# Patient Record
Sex: Female | Born: 1965 | Race: White | Hispanic: No | Marital: Married | Smoking: Never smoker
Health system: Southern US, Community
[De-identification: ages and names within clinical notes are randomized; demographics above are authoritative.]

## PROBLEM LIST (undated history)

## (undated) DIAGNOSIS — E785 Hyperlipidemia, unspecified: Secondary | ICD-10-CM

## (undated) DIAGNOSIS — F419 Anxiety disorder, unspecified: Secondary | ICD-10-CM

## (undated) HISTORY — PX: LUNG LOBECTOMY: SHX167

---

## 2011-11-01 ENCOUNTER — Encounter: Payer: Self-pay | Admitting: *Deleted

## 2011-11-01 ENCOUNTER — Emergency Department
Admission: EM | Admit: 2011-11-01 | Discharge: 2011-11-01 | Disposition: A | Discharge status: Home / Self Care | Payer: 59 | Source: Home / Self Care | Attending: Emergency Medicine | Admitting: Emergency Medicine

## 2011-11-01 DIAGNOSIS — R079 Chest pain, unspecified: Secondary | ICD-10-CM

## 2011-11-01 MED ORDER — METHYLPREDNISOLONE SODIUM SUCC 125 MG IJ SOLR
125.0000 mg | Freq: Once | INTRAMUSCULAR | Status: AC
  Administered 2011-11-01: 125 mg via INTRAMUSCULAR

## 2011-11-01 MED ORDER — METHYLPREDNISOLONE SODIUM SUCC 125 MG IJ SOLR: 125.0000 mg | Freq: Once | INTRAMUSCULAR | Status: DC

## 2011-11-01 NOTE — ED Notes (Signed)
Patient received a flu shot yesterday afternoon. About 3 hours later she started to feel like"something was inmy throat". Today the feeling in her throat has lessened but now has a "tightness" in her chest. No rash, no SOB or distress. EKG done-WNL, 02 sat 100%.

## 2011-11-01 NOTE — ED Provider Notes (Signed)
History     CSN: [id number]  Arrival date & time 11/01/11  1234   First MD Initiated Contact with Patient 11/01/11 1342      Chief Complaint  Patient presents with  . Chest Pain    (Consider location/radiation/quality/duration/timing/severity/associated sxs/prior treatment) HPI This patient presents today with tightness in her upper chest and throat. Yesterday she received a flu shot in her left arm. About 3 hours later she started feeling strange symptoms in her chest. No rashes no shortness of breath or distress. She has had a flu shot in the past with no reaction. 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 She has a slight runny nose and nasal congestion as well. She is not allergic to. She has not started any new medicines recently.   Past Medical History  Diagnosis Date  . Diabetes mellitus     type II, diet controlled    Past Surgical History  Procedure Date  . Lung lobectomy     right lung, partial    Family History  Problem Relation Age of Onset  . Diabetes Mother   . Diabetes Father     History  Substance Use Topics  . Smoking status: Never Smoker   . Smokeless tobacco: Not on file  . Alcohol Use: No    OB History    Grav Para Term Preterm Abortions TAB SAB Ect Mult Living                  Review of Systems  Allergies  Augmentin  Home Medications  No current outpatient prescriptions on file.  BP 120/80  Pulse 72  Temp(Src) 98.7 F (37.1 C) (Oral)  Resp 14  Ht 5\' 6"  (1.676 m)  Wt 150 lb (68.04 kg)  BMI 24.21 kg/m2  SpO2 100%  Physical Exam  Nursing note and vitals reviewed. Constitutional: She is oriented to person, place, and time. She appears well-developed and well-nourished.  Non-toxic appearance. She does not have a sickly appearance. She does not appear ill. No distress.  HENT:  Head: Normocephalic and atraumatic.  Right Ear: Tympanic membrane, external ear and ear canal normal.  Left Ear: Tympanic  membrane, external ear and ear canal normal.  Nose: Nose normal.  Mouth/Throat: No oropharyngeal exudate, posterior oropharyngeal edema or posterior oropharyngeal erythema.       Oropharynx is patent with no tonsillar swelling. No lymphadenopathy or swelling in the neck area.  Eyes: No scleral icterus.  Neck: Trachea normal, normal range of motion and phonation normal. Neck supple.  Cardiovascular: Normal rate, regular rhythm and normal heart sounds.   Pulmonary/Chest: Effort normal and breath sounds normal. No respiratory distress. She has no decreased breath sounds. She has no wheezes. She has no rhonchi.  Neurological: She is alert and oriented to person, place, and time.  Skin: Skin is warm and dry. No rash noted.  Psychiatric: She has a normal mood and affect. Her speech is normal.    ED Course  Procedures (including critical care time)  Labs Reviewed - No data to display No results found.   No diagnosis found.    MDM   An EKG is done in clinic to rule out a cardiac etiology and it is normal. Her oxygen saturation is 100% and her vitals are completely normal. Her physical examination is also completely normal  Differential diagnosis includes a allergic reaction to the flu shot,  and  I have given her a shot of Solu-Medrol  125 today in clinic to make sure that no symptoms become worse. Differential diagnosis also includes the beginning of a viral infection. If she is having any worsening chest pain or shortness of breath, I advised her to go to the emergency room for evaluation.       Lily Kocher, MD 11/01/11 [phone]

## 2012-01-28 ENCOUNTER — Emergency Department
Admission: EM | Admit: 2012-01-28 | Discharge: 2012-01-28 | Disposition: A | Discharge status: Home / Self Care | Payer: 59 | Source: Home / Self Care | Attending: Family Medicine | Admitting: Family Medicine

## 2012-01-28 DIAGNOSIS — N3000 Acute cystitis without hematuria: Secondary | ICD-10-CM

## 2012-01-28 LAB — POCT URINALYSIS DIP (MANUAL ENTRY)
Ketones, POC UA: NEGATIVE
Nitrite, UA: NEGATIVE
Protein Ur, POC: NEGATIVE
Urobilinogen, UA: 0.2 (ref 0–1)
pH, UA: 5.5 (ref 5–8)

## 2012-01-28 MED ORDER — SULFAMETHOXAZOLE-TRIMETHOPRIM 800-160 MG PO TABS: 1.0000 | ORAL_TABLET | Freq: Two times a day (BID) | ORAL | Status: AC

## 2012-01-28 MED ORDER — PHENAZOPYRIDINE HCL 200 MG PO TABS: 200.0000 mg | ORAL_TABLET | Freq: Three times a day (TID) | ORAL | Status: AC

## 2012-01-28 NOTE — Discharge Instructions (Signed)
Continue increased fluid intake.  Urinary Tract Infection Infections of the urinary tract can start in several places. A bladder infection (cystitis), a kidney infection (pyelonephritis), and a prostate infection (prostatitis) are different types of urinary tract infections (UTIs). They usually get better if treated with medicines (antibiotics) that kill germs. Take all the medicine until it is gone. You or your child may feel better in a few days, but TAKE ALL MEDICINE or the infection may not respond and may become more difficult to treat. HOME CARE INSTRUCTIONS   Drink enough water and fluids to keep the urine clear or pale yellow. Cranberry juice is especially recommended, in addition to large amounts of water.   Avoid caffeine, tea, and carbonated beverages. They tend to irritate the bladder.   Alcohol may irritate the prostate.   Only take over-the-counter or prescription medicines for pain, discomfort, or fever as directed by your caregiver.  To prevent further infections:  Empty the bladder often. Avoid holding urine for long periods of time.   After a bowel movement, women should cleanse from front to back. Use each tissue only once.   Empty the bladder before and after sexual intercourse.  FINDING OUT THE RESULTS OF YOUR TEST Not all test results are available during your visit. If your or your child's test results are not back during the visit, make an appointment with your caregiver to find out the results. Do not assume everything is normal if you have not heard from your caregiver or the medical facility. It is important for you to follow up on all test results. SEEK MEDICAL CARE IF:   There is back pain.   Your baby is older than 3 months with a rectal temperature of 100.5 F (38.1 C) or higher for more than 1 day.   Your or your child's problems (symptoms) are no better in 3 days. Return sooner if you or your child is getting worse.  SEEK IMMEDIATE MEDICAL CARE IF:    There is severe back pain or lower abdominal pain.   You or your child develops chills.   You have a fever.   Your baby is older than 3 months with a rectal temperature of 102 F (38.9 C) or higher.   Your baby is 84 months old or younger with a rectal temperature of 100.4 F (38 C) or higher.   There is nausea or vomiting.   There is continued burning or discomfort with urination.  MAKE SURE YOU:   Understand these instructions.   Will watch your condition.   Will get help right away if you are not doing well or get worse.  Document Released: 07/20/2005 Document Revised: 09/29/2011 Document Reviewed: 02/22/2007 [facility] Patient Information 2012 [place], [place].

## 2012-01-28 NOTE — ED Provider Notes (Signed)
History     CSN: [id number]  Arrival date & time 01/28/12  1236   First MD Initiated Contact with Patient 01/28/12 1310      Chief Complaint  Patient presents with  . Urinary Frequency      HPI Comments: Patient complains of developing urinary urgency about 3 to 4 days ago.  She has also had hesitancy, dysuria, and mild left low back ache.  No abdominal or pelvic pain.  No vaginal discharge.  She states that her last menstrual period was in November, and that she normally has irregular periods.  No fevers, chills, and sweats.  No nausea/vomiting.  The history is provided by the patient.    Past Medical History  Diagnosis Date  . Diabetes mellitus     type II, diet controlled    Past Surgical History  Procedure Date  . Lung lobectomy     right lung, partial    Family History  Problem Relation Age of Onset  . Diabetes Mother   . Diabetes Father     History  Substance Use Topics  . Smoking status: Never Smoker   . Smokeless tobacco: Not on file  . Alcohol Use: No    OB History    Grav Para Term Preterm Abortions TAB SAB Ect Mult Living                  Review of Systems No sore throat No cough No pleuritic pain No wheezing No nasal congestion No post-nasal drainage No sinus pain/pressure No itchy/red eyes No earache No hemoptysis No SOB No fever/chills No nausea No vomiting No abdominal pain No diarrhea + urinary symptoms No skin rashes No  fatigue No myalgias No headache   Allergies  Augmentin  Home Medications   Current Outpatient Rx  Name Route Sig Dispense Refill  . PHENAZOPYRIDINE HCL 200 MG PO TABS Oral Take 1 tablet (200 mg total) by mouth 3 (three) times daily. Take with food. 6 tablet 0  . SULFAMETHOXAZOLE-TRIMETHOPRIM 800-160 MG PO TABS Oral Take 1 tablet by mouth 2 (two) times daily. 10 tablet 0    BP 119/83  Pulse 79  Temp(Src) 98.4 F (36.9 C) (Oral)  Resp 18  Ht 5\' 6"  (1.676 m)  Wt 147 lb (66.679 kg)  BMI 23.73 kg/m2  SpO2 99%  Physical Exam Nursing notes and Vital Signs reviewed. Appearance:  Patient appears healthy, stated age, and in no acute distress Eyes:  Pupils are equal, round, and reactive to light and accomodation.  Extraocular movement is intact.  Conjunctivae are not inflamed   Pharynx:  Normal; moist mucous membranes  Neck:  Supple.  No adenopathy Lungs:  Clear to auscultation.  Breath sounds are equal.  Heart:  Regular rate and rhythm without murmurs, rubs, or gallops.  Abdomen:  Nontender without masses or hepatosplenomegaly.  Bowel sounds are present.  Mild left flank tenderness.  Extremities:  No edema.  No calf tenderness Skin:  No rash present.    ED Course  Procedures none   Labs Reviewed  POCT URINALYSIS DIP (MANUAL ENTRY) large blood, large leuks, otherwise negative  aa  URINE CULTURE pending      1. Acute cystitis       MDM  Urine culture pending.  Begin Septra DS and Pyridium. Continue increased fluid  intake. Followup with Family Doctor if not improved 5 days, or if symptoms worsen.        Lattie Haw, MD 01/28/12 1434

## 2012-01-28 NOTE — ED Notes (Signed)
Urinary  Frequency and burning started early this week

## 2012-01-29 LAB — URINE CULTURE
Colony Count: NO GROWTH
Organism ID, Bacteria: NO GROWTH

## 2012-01-30 ENCOUNTER — Telehealth: Payer: Self-pay | Admitting: Family Medicine

## 2015-07-02 ENCOUNTER — Encounter: Payer: Self-pay | Admitting: *Deleted

## 2015-07-02 ENCOUNTER — Emergency Department (INDEPENDENT_AMBULATORY_CARE_PROVIDER_SITE_OTHER): Payer: Managed Care, Other (non HMO)

## 2015-07-02 ENCOUNTER — Emergency Department (INDEPENDENT_AMBULATORY_CARE_PROVIDER_SITE_OTHER)
Admission: EM | Admit: 2015-07-02 | Discharge: 2015-07-02 | Disposition: A | Discharge status: Home / Self Care | Payer: Managed Care, Other (non HMO) | Source: Home / Self Care | Attending: Family Medicine | Admitting: Family Medicine

## 2015-07-02 DIAGNOSIS — X58XXXA Exposure to other specified factors, initial encounter: Secondary | ICD-10-CM

## 2015-07-02 DIAGNOSIS — S92355A Nondisplaced fracture of fifth metatarsal bone, left foot, initial encounter for closed fracture: Secondary | ICD-10-CM

## 2015-07-02 NOTE — Discharge Instructions (Signed)
Apply ice pack for 30 minutes every 1 to 2 hours today and tomorrow.  Elevate.  Use crutches for 3 to 5 days.  Wear Ace wrap until swelling decreases.  Wear cam walker for about 3 weeks.  May take Ibuprofen , 4 tabs every 8 hours with food for pain.   Metatarsal Fracture, Undisplaced A metatarsal fracture is a break in the bone(s) of the foot. These are the bones of the foot that connect your toes to the bones of the ankle. DIAGNOSIS  The diagnoses of these fractures are usually made with X-rays. If there are problems in the forefoot and x-rays are normal a later bone scan will usually make the diagnosis.  TREATMENT AND HOME CARE INSTRUCTIONS  Treatment may or may not include a cast or walking shoe. When casts are needed the use is usually for short periods of time so as not to slow down healing with muscle wasting (atrophy).  Activities should be stopped until further advised by your caregiver.  Wear shoes with adequate shock absorbing capabilities and stiff soles.  Alternative exercise may be undertaken while waiting for healing. These may include bicycling and swimming, or as your caregiver suggests.  It is important to keep all follow-up visits or specialty referrals. The failure to keep these appointments could result in improper bone healing and chronic pain or disability.  Warning: Do not drive a car or operate a motor vehicle until your caregiver specifically tells you it is safe to do so. IF YOU DO NOT HAVE A CAST OR SPLINT:  You may walk on your injured foot as tolerated or advised.  Do not put any weight on your injured foot for as long as directed by your caregiver. Slowly increase the amount of time you walk on the foot as the pain allows or as advised.  Use crutches until you can bear weight without pain. A gradual increase in weight bearing may help.  Apply ice to the injury for 15-20 minutes each hour while awake for the first 2 days. Put the ice in a plastic bag and  place a towel between the bag of ice and your skin.  Only take over-the-counter or prescription medicines for pain, discomfort, or fever as directed by your caregiver. SEEK IMMEDIATE MEDICAL CARE IF:   Your cast gets damaged or breaks.  You have continued severe pain or more swelling than you did before the cast was put on, or the pain is not controlled with medications.  Your skin or nails below the injury turn blue or grey, or feel cold or numb.  There is a bad smell, or new stains or pus-like (purulent) drainage coming from the cast. MAKE SURE YOU:   Understand these instructions.  Will watch your condition.  Will get help right away if you are not doing well or get worse. Document Released: 07/02/2002 Document Revised: 01/02/2012 Document Reviewed: 05/23/2008 [facility] Patient Information 2015 [place], [place]. This information is not intended to replace advice given to you by your health care provider. Make sure you discuss any questions you have with your health care provider.

## 2015-07-02 NOTE — ED Provider Notes (Signed)
CSN: [id number]     Arrival date & time 07/02/15  1747 History   First MD Initiated Contact with Patient 07/02/15 1820     Chief Complaint  Patient presents with  . Foot Injury     HPI Comments: While stepping off a chair today, patient inverted her left foot/ankle and felt immediate pain in the lateral aspect of her foot.  Patient is a 49 y.o. female presenting with foot injury. The history is provided by the patient.  Foot Injury Location:  Foot Time since incident:  2 hours Injury: yes   Mechanism of injury comment:  Inverted foot/ankle Foot location:  L foot Pain details:    Quality:  Aching   Radiates to:  Does not radiate   Severity:  Moderate   Onset quality:  Sudden   Duration:  2 hours   Timing:  Constant   Progression:  Unchanged Chronicity:  New Prior injury to area:  No Relieved by:  None tried Worsened by:  Bearing weight Ineffective treatments:  None tried Associated symptoms: decreased ROM, stiffness and swelling   Associated symptoms: no muscle weakness, no numbness and no tingling     Past Medical History  Diagnosis Date  . Diabetes mellitus     type II, diet controlled   Past Surgical History  Procedure Laterality Date  . Lung lobectomy      right lung, partial   Family History  Problem Relation Age of Onset  . Diabetes Mother   . Diabetes Father    Social History  Substance Use Topics  . Smoking status: Never Smoker   . Smokeless tobacco: None  . Alcohol Use: No   OB History    No data available     Review of Systems  Musculoskeletal: Positive for stiffness.  All other systems reviewed and are negative.   Allergies  Amoxicillin-pot clavulanate  Home Medications   Prior to Admission medications   Not on File   Meds Ordered and Administered this Visit  Medications - No data to display  BP 137/89 mmHg  Pulse 80  Resp 16  Wt 135 lb (61.236 kg)  SpO2 100% No data found.   Physical Exam  Constitutional: She is oriented to  person, place, and time. She appears well-developed and well-nourished. No distress.  HENT:  Head: Atraumatic.  Eyes: Conjunctivae are normal. Pupils are equal, round, and reactive to light.  Musculoskeletal:       Left foot: There is tenderness, bony tenderness and swelling. There is normal range of motion, normal capillary refill, no crepitus, no deformity and no laceration.       Feet:  Left foot has distinct tenderness to palpation laterally over the 5th metatarsal mid-shaft.  Left ankle has full range of motion without tenderness to palpation.  Neurological: She is alert and oriented to person, place, and time.  Skin: Skin is warm and dry.  Nursing note and vitals reviewed.   ED Course  Procedures  None  Imaging Review Dg Foot Complete Left  07/02/2015   CLINICAL DATA:  Fifth metatarsal pain secondary to a fall today.  EXAM: LEFT FOOT - COMPLETE 3+ VIEW  COMPARISON:  None.  FINDINGS: There is a nondisplaced spiral fracture of the shaft of the fifth metatarsal. Osseous structures are otherwise normal.  IMPRESSION: Nondisplaced spiral fracture of the shaft of the fifth metatarsal.   Electronically Signed   By: Francene Boyers M.D.   On: 07/02/2015 19:02      MDM  1. Closed nondisplaced fracture of fifth left metatarsal bone, initial encounter    Discussed with Dr. Rodney Langton.  Ace wrap and cam walker applied.  Patient has crutches at home.  Apply ice pack for 30 minutes every 1 to 2 hours today and tomorrow.  Elevate.  Use crutches for 3 to 5 days.  Wear Ace wrap until swelling decreases.  Wear cam walker for about 3 weeks.  May take Ibuprofen 200mg , 4 tabs every 8 hours with food for pain. Followup with Dr. Rodney Langton or Dr. Clementeen [facility] ([facility]) in one week.     Lattie Haw, MD 07/02/15 [phone]

## 2015-07-02 NOTE — ED Notes (Signed)
Pt reports stepping off of a chair @ school, rolling her left foot. Pain, swelling and bruising present. No previous injury.

## 2015-07-13 ENCOUNTER — Ambulatory Visit (INDEPENDENT_AMBULATORY_CARE_PROVIDER_SITE_OTHER): Payer: Managed Care, Other (non HMO) | Admitting: Family Medicine

## 2015-07-13 ENCOUNTER — Encounter: Payer: Self-pay | Admitting: Family Medicine

## 2015-07-13 ENCOUNTER — Ambulatory Visit (INDEPENDENT_AMBULATORY_CARE_PROVIDER_SITE_OTHER): Payer: Managed Care, Other (non HMO)

## 2015-07-13 VITALS — BP 139/79 | HR 96 | Wt 135.0 lb

## 2015-07-13 DIAGNOSIS — S92302A Fracture of unspecified metatarsal bone(s), left foot, initial encounter for closed fracture: Secondary | ICD-10-CM | POA: Diagnosis not present

## 2015-07-13 DIAGNOSIS — S92353A Displaced fracture of fifth metatarsal bone, unspecified foot, initial encounter for closed fracture: Secondary | ICD-10-CM | POA: Insufficient documentation

## 2015-07-13 DIAGNOSIS — S92355D Nondisplaced fracture of fifth metatarsal bone, left foot, subsequent encounter for fracture with routine healing: Secondary | ICD-10-CM | POA: Diagnosis not present

## 2015-07-13 DIAGNOSIS — X58XXXD Exposure to other specified factors, subsequent encounter: Secondary | ICD-10-CM | POA: Diagnosis not present

## 2015-07-13 NOTE — Patient Instructions (Signed)
Thank you for coming in today. Continue the cam walker with crutches as needed.,  Follow up with me in 2 weeks.  Let pain be your guide.  Consider contacting a workers comp Social worker firm to see if you have a case.   Metatarsal Fracture, Undisplaced A metatarsal fracture is a break in the bone(s) of the foot. These are the bones of the foot that connect your toes to the bones of the ankle. DIAGNOSIS  The diagnoses of these fractures are usually made with X-rays. If there are problems in the forefoot and x-rays are normal a later bone scan will usually make the diagnosis.  TREATMENT AND HOME CARE INSTRUCTIONS  Treatment may or may not include a cast or walking shoe. When casts are needed the use is usually for short periods of time so as not to slow down healing with muscle wasting (atrophy).  Activities should be stopped until further advised by your caregiver.  Wear shoes with adequate shock absorbing capabilities and stiff soles.  Alternative exercise may be undertaken while waiting for healing. These may include bicycling and swimming, or as your caregiver suggests.  It is important to keep all follow-up visits or specialty referrals. The failure to keep these appointments could result in improper bone healing and chronic pain or disability.  Warning: Do not drive a car or operate a motor vehicle until your caregiver specifically tells you it is safe to do so. IF YOU DO NOT HAVE A CAST OR SPLINT:  You may walk on your injured foot as tolerated or advised.  Do not put any weight on your injured foot for as long as directed by your caregiver. Slowly increase the amount of time you walk on the foot as the pain allows or as advised.  Use crutches until you can bear weight without pain. A gradual increase in weight bearing may help.  Apply ice to the injury for 15-20 minutes each hour while awake for the first 2 days. Put the ice in a plastic bag and place a towel between the bag of ice and  your skin.  Only take over-the-counter or prescription medicines for pain, discomfort, or fever as directed by your caregiver. SEEK IMMEDIATE MEDICAL CARE IF:   Your cast gets damaged or breaks.  You have continued severe pain or more swelling than you did before the cast was put on, or the pain is not controlled with medications.  Your skin or nails below the injury turn blue or grey, or feel cold or numb.  There is a bad smell, or new stains or pus-like (purulent) drainage coming from the cast. MAKE SURE YOU:   Understand these instructions.  Will watch your condition.  Will get help right away if you are not doing well or get worse. Document Released: 07/02/2002 Document Revised: 01/02/2012 Document Reviewed: 05/23/2008 [facility] Patient Information 2015 [place], [place]. This information is not intended to replace advice given to you by your health care provider. Make sure you discuss any questions you have with your health care provider.

## 2015-07-13 NOTE — Progress Notes (Signed)
   Subjective:    I'm seeing this patient as a consultation for:  Dr. Cathren Harsh  CC: Left fifth metatarsal fracture  HPI: Patient suffered a metatarsal fracture work on 07/02/2015. She was seen in urgent care with a fracture was diagnosed. She was placed into a cam walking boot nonweightbearing status. She notes that she is feeling pretty well with minimal pain. She's tried ibuprofen for pain which helps some. No radiating pain weakness or numbness fevers or chills.  Past medical history, Surgical history, Family history not pertinant except as noted below, Social history, Allergies, and medications have been entered into the medical record, reviewed, and no changes needed.   Review of Systems: No headache, visual changes, nausea, vomiting, diarrhea, constipation, dizziness, abdominal pain, skin rash, fevers, chills, night sweats, weight loss, swollen lymph nodes, body aches, joint swelling, muscle aches, chest pain, shortness of breath, mood changes, visual or auditory hallucinations.   Objective:    Filed Vitals:   07/13/15 1501  BP: 139/79  Pulse: 96   General: Well Developed, well nourished, and in no acute distress.  Neuro/Psych: Alert and oriented x3, extra-ocular muscles intact, able to move all 4 extremities, sensation grossly intact. Skin: Warm and dry, no rashes noted.  Respiratory: Not using accessory muscles, speaking in full sentences, trachea midline.  Cardiovascular: Pulses palpable, no extremity edema. Abdomen: Does not appear distended. MSK: Left foot mild ecchymosis and swelling. Minimally tender over the distal fifth metatarsal shaft. Pulses capillary refill sensation intact. Normal ankle motion.   X-ray pending. Preliminary review of x-ray shows a spiral type fifth metatarsal shaft fracture towards the distal end of the shaft without significant displacement. Awaiting formal x-ray review  No results found for this or any previous visit (from the past 24 hour(s)). No  results found.  Impression and Recommendations:   This case required medical decision making of moderate complexity.

## 2015-07-13 NOTE — Assessment & Plan Note (Signed)
Doing well. Continue cam walker.*Limited weightbearing advance to full weightbearing. Return in 2 weeks for repeat x-ray.

## 2015-07-27 ENCOUNTER — Encounter: Payer: Self-pay | Admitting: Family Medicine

## 2015-07-27 ENCOUNTER — Ambulatory Visit (INDEPENDENT_AMBULATORY_CARE_PROVIDER_SITE_OTHER): Payer: Managed Care, Other (non HMO) | Admitting: Family Medicine

## 2015-07-27 ENCOUNTER — Ambulatory Visit (INDEPENDENT_AMBULATORY_CARE_PROVIDER_SITE_OTHER): Payer: Managed Care, Other (non HMO)

## 2015-07-27 VITALS — BP 125/79 | HR 90 | Wt 147.0 lb

## 2015-07-27 DIAGNOSIS — Z780 Asymptomatic menopausal state: Secondary | ICD-10-CM

## 2015-07-27 DIAGNOSIS — X58XXXD Exposure to other specified factors, subsequent encounter: Secondary | ICD-10-CM

## 2015-07-27 DIAGNOSIS — S92301D Fracture of unspecified metatarsal bone(s), right foot, subsequent encounter for fracture with routine healing: Secondary | ICD-10-CM | POA: Diagnosis not present

## 2015-07-27 DIAGNOSIS — S92352D Displaced fracture of fifth metatarsal bone, left foot, subsequent encounter for fracture with routine healing: Secondary | ICD-10-CM | POA: Diagnosis not present

## 2015-07-27 NOTE — Assessment & Plan Note (Signed)
Delayed healing. Continue current management with limited weightbearing cam walker boot. Arrange for bone density test to evaluate for osteoporosis.

## 2015-07-27 NOTE — Patient Instructions (Signed)
Thank you for coming in today. Return in 2 weeks for recheck.  Limit weight bearing.  You should have a bone density test soon.  They should call you.  Let me know if you do not hear back.

## 2015-07-27 NOTE — Progress Notes (Signed)
Lori Pratt is a 49 y.o. female who presents to [name]  today for foot fracture. Patient suffered a fracture to her left fifth metatarsal on September 8. She was seen in my clinic on the 19th. In the interim she has continued limited weightbearing status with a cam walker boot. She feels well and notes pain has reduced.  Patient notes she is about 5 years postmenopausal.   Past Medical History  Diagnosis Date  . Diabetes mellitus     type II, diet controlled   Past Surgical History  Procedure Laterality Date  . Lung lobectomy      right lung, partial   Social History  Substance Use Topics  . Smoking status: Never Smoker   . Smokeless tobacco: Not on file  . Alcohol Use: No   family history includes Diabetes in her father and mother; Leukemia in her maternal grandmother.  ROS as above Medications: Current Outpatient Prescriptions  Medication Sig Dispense Refill  . ibuprofen (ADVIL,MOTRIN) 200 MG tablet Take 200 mg by mouth every 6 (six) hours as needed.     No current facility-administered medications for this visit.   Allergies  Allergen Reactions  . Amoxicillin-Pot Clavulanate Rash     Exam:  BP 125/79 mmHg  Pulse 90  Wt 147 lb (66.679 kg) Gen: Well NAD HEENT: EOMI,  MMM Lungs: Normal work of breathing. CTABL Heart: RRR no MRG Abd: NABS, Soft. Nondistended, Nontender Exts: Brisk capillary refill, warm and well perfused.  Left foot well-appearing nontender no effusion or ecchymosis  Preliminary x-ray of left foot shows bone absorption at the fracture site without definitive callus formation. Nondisplaced. Awaiting formal radiology read.  No results found for this or any previous visit (from the past 24 hour(s)). No results found.   Please see individual assessment and plan sections.

## 2015-07-28 NOTE — Progress Notes (Signed)
Quick Note:  The fracture is healing. ______

## 2015-07-29 ENCOUNTER — Telehealth: Payer: Self-pay

## 2015-07-29 NOTE — Telephone Encounter (Signed)
Patient advised of results.

## 2015-08-06 ENCOUNTER — Other Ambulatory Visit: Payer: Managed Care, Other (non HMO)

## 2015-08-10 ENCOUNTER — Ambulatory Visit (INDEPENDENT_AMBULATORY_CARE_PROVIDER_SITE_OTHER): Payer: Managed Care, Other (non HMO)

## 2015-08-10 ENCOUNTER — Ambulatory Visit (INDEPENDENT_AMBULATORY_CARE_PROVIDER_SITE_OTHER): Payer: Managed Care, Other (non HMO) | Admitting: Family Medicine

## 2015-08-10 ENCOUNTER — Encounter: Payer: Self-pay | Admitting: Family Medicine

## 2015-08-10 VITALS — BP 144/88 | HR 87 | Wt 147.0 lb

## 2015-08-10 DIAGNOSIS — S92352D Displaced fracture of fifth metatarsal bone, left foot, subsequent encounter for fracture with routine healing: Secondary | ICD-10-CM

## 2015-08-10 DIAGNOSIS — S92302D Fracture of unspecified metatarsal bone(s), left foot, subsequent encounter for fracture with routine healing: Secondary | ICD-10-CM

## 2015-08-10 DIAGNOSIS — X58XXXD Exposure to other specified factors, subsequent encounter: Secondary | ICD-10-CM | POA: Diagnosis not present

## 2015-08-10 NOTE — Patient Instructions (Signed)
Thank you for coming in today. Switch to a post op shoe.  Stop the crutches if you can.  Be careful with it.  Return in 2 weeks.

## 2015-08-10 NOTE — Progress Notes (Signed)
Lori Pratt is a 49 y.o. female who [name]  today for follow-up foot fracture. Patient was seen originally on September 8 for a fifth metatarsal fracture. In the interim she's been limited weightbearing and Cam Walker boot. She was doing well with minimal to no pain until lunchtime today when she accidentally bumped her foot causing worse pain.    Past Medical History  Diagnosis Date  . Diabetes mellitus     type II, diet controlled   Past Surgical History  Procedure Laterality Date  . Lung lobectomy      right lung, partial   Social History  Substance Use Topics  . Smoking status: Never Smoker   . Smokeless tobacco: Not on file  . Alcohol Use: No   family history includes Diabetes in her father and mother; Leukemia in her maternal grandmother.  ROS as above Medications: Current Outpatient Prescriptions  Medication Sig Dispense Refill  . ibuprofen (ADVIL,MOTRIN) 200 MG tablet Take 200 mg by mouth every 6 (six) hours as needed.     No current facility-administered medications for this visit.   Allergies  Allergen Reactions  . Amoxicillin-Pot Clavulanate Rash     Exam:  BP 144/88 mmHg  Pulse 87  Wt 147 lb (66.679 kg) Gen: Well NAD Left foot well-appearing nontender normal pulses capillary refill and sensation.  No results found for this or any previous visit (from the past 24 hour(s)). No results found.   Please see individual assessment and plan sections.

## 2015-08-10 NOTE — Assessment & Plan Note (Signed)
Healing well. Bone density test pending. Transition to postop shoe. Recheck 2 weeks.

## 2015-08-13 ENCOUNTER — Ambulatory Visit (INDEPENDENT_AMBULATORY_CARE_PROVIDER_SITE_OTHER): Payer: Managed Care, Other (non HMO)

## 2015-08-13 DIAGNOSIS — Z13820 Encounter for screening for osteoporosis: Secondary | ICD-10-CM | POA: Diagnosis not present

## 2015-08-13 DIAGNOSIS — Z780 Asymptomatic menopausal state: Secondary | ICD-10-CM | POA: Diagnosis not present

## 2015-08-13 DIAGNOSIS — S92301D Fracture of unspecified metatarsal bone(s), right foot, subsequent encounter for fracture with routine healing: Secondary | ICD-10-CM

## 2015-08-14 ENCOUNTER — Encounter: Payer: Self-pay | Admitting: Family Medicine

## 2015-08-14 DIAGNOSIS — M8580 Other specified disorders of bone density and structure, unspecified site: Secondary | ICD-10-CM | POA: Insufficient documentation

## 2015-08-14 NOTE — Progress Notes (Signed)
Quick Note:  Bone density shows osteopenia. This is early or mild osteoperosis. I recommend taking calcium and vitamin D supplementation. At 49 years old and may be reasonable to start medicines to help strengthen the bones. ______

## 2015-08-24 ENCOUNTER — Ambulatory Visit (INDEPENDENT_AMBULATORY_CARE_PROVIDER_SITE_OTHER): Payer: Managed Care, Other (non HMO)

## 2015-08-24 ENCOUNTER — Ambulatory Visit (INDEPENDENT_AMBULATORY_CARE_PROVIDER_SITE_OTHER): Payer: Managed Care, Other (non HMO) | Admitting: Family Medicine

## 2015-08-24 ENCOUNTER — Encounter: Payer: Self-pay | Admitting: Family Medicine

## 2015-08-24 VITALS — BP 129/83 | HR 85 | Wt 150.0 lb

## 2015-08-24 DIAGNOSIS — E559 Vitamin D deficiency, unspecified: Secondary | ICD-10-CM

## 2015-08-24 DIAGNOSIS — S92352D Displaced fracture of fifth metatarsal bone, left foot, subsequent encounter for fracture with routine healing: Secondary | ICD-10-CM

## 2015-08-24 DIAGNOSIS — S92302D Fracture of unspecified metatarsal bone(s), left foot, subsequent encounter for fracture with routine healing: Secondary | ICD-10-CM | POA: Diagnosis not present

## 2015-08-24 DIAGNOSIS — X58XXXD Exposure to other specified factors, subsequent encounter: Secondary | ICD-10-CM | POA: Diagnosis not present

## 2015-08-24 DIAGNOSIS — M8580 Other specified disorders of bone density and structure, unspecified site: Secondary | ICD-10-CM

## 2015-08-24 NOTE — Progress Notes (Signed)
Lori Pratt is a 49 y.o. female who [name]  today for follow-up fifth metatarsal fracture. Patient was originally seen in early September where she was diagnosed with a fracture of her left fifth metatarsal. She was initially casted placed into a cam walker. For the last 2 weeks she's been using a rigid soled postoperative shoe. She notes the pain is much better controlled. She denies any fevers chills nausea vomiting or diarrhea.  Additionally the last visit she had a bone density scan ordered which showed osteopenia.   Past Medical History  Diagnosis Date  . Diabetes mellitus     type II, diet controlled   Past Surgical History  Procedure Laterality Date  . Lung lobectomy      right lung, partial   Social History  Substance Use Topics  . Smoking status: Never Smoker   . Smokeless tobacco: Not on file  . Alcohol Use: No   family history includes Diabetes in her father and mother; Leukemia in her maternal grandmother.  ROS as above Medications: Current Outpatient Prescriptions  Medication Sig Dispense Refill  . ibuprofen (ADVIL,MOTRIN) 200 MG tablet Take 200 mg by mouth every 6 (six) hours as needed.     No current facility-administered medications for this visit.   Allergies  Allergen Reactions  . Amoxicillin-Pot Clavulanate Rash     Exam:  BP 129/83 mmHg  Pulse 85  Wt 150 lb (68.04 kg) Gen: Well NAD HEENT: EOMI,  MMM Lungs: Normal work of breathing. CTABL Heart: RRR no MRG Abd: NABS, Soft. Nondistended, Nontender Exts: Brisk capillary refill, warm and well perfused.   No results found for this or any previous visit (from the past 24 hour(s)). No results found.   Please see individual assessment and plan sections.

## 2015-08-24 NOTE — Patient Instructions (Signed)
Thank you for coming in today. Continue the shoe.  Return in 2-4 weeks.  We will check Vitamin D levels today.

## 2015-08-26 NOTE — Assessment & Plan Note (Signed)
Delayed healing. Continue postoperative shoe as patient is still tender. Recheck in about 2-4 weeks.

## 2015-09-12 LAB — VITAMIN D 25 HYDROXY (VIT D DEFICIENCY, FRACTURES): Vit D, 25-Hydroxy: 20 ng/mL — ABNORMAL LOW (ref 30–100)

## 2015-09-14 ENCOUNTER — Ambulatory Visit (INDEPENDENT_AMBULATORY_CARE_PROVIDER_SITE_OTHER): Payer: Managed Care, Other (non HMO) | Admitting: Family Medicine

## 2015-09-14 ENCOUNTER — Ambulatory Visit (INDEPENDENT_AMBULATORY_CARE_PROVIDER_SITE_OTHER): Payer: Managed Care, Other (non HMO)

## 2015-09-14 ENCOUNTER — Encounter: Payer: Self-pay | Admitting: Family Medicine

## 2015-09-14 VITALS — BP 144/88 | HR 76 | Wt 150.0 lb

## 2015-09-14 DIAGNOSIS — S92352G Displaced fracture of fifth metatarsal bone, left foot, subsequent encounter for fracture with delayed healing: Secondary | ICD-10-CM | POA: Diagnosis not present

## 2015-09-14 DIAGNOSIS — E559 Vitamin D deficiency, unspecified: Secondary | ICD-10-CM | POA: Insufficient documentation

## 2015-09-14 DIAGNOSIS — S92302G Fracture of unspecified metatarsal bone(s), left foot, subsequent encounter for fracture with delayed healing: Secondary | ICD-10-CM

## 2015-09-14 DIAGNOSIS — X58XXXD Exposure to other specified factors, subsequent encounter: Secondary | ICD-10-CM | POA: Diagnosis not present

## 2015-09-14 MED ORDER — VITAMIN D (ERGOCALCIFEROL) 1.25 MG (50000 UNIT) PO CAPS: 50000.0000 [IU] | ORAL_CAPSULE | ORAL | Status: DC

## 2015-09-14 NOTE — Progress Notes (Signed)
Lori Pratt is a 49 y.o. female who [name]  today for follow-up foot fracture. Patient was originally seen in urgent care on September 8th. She's been treated with casting and slowly advanced to weightbearing. On October 31 she was last seen and treated with a postoperative shoe. Since then she's noted worsening soreness in the area of the fracture. She's not had much healing on x-ray.   Past Medical History  Diagnosis Date  . Diabetes mellitus     type II, diet controlled   Past Surgical History  Procedure Laterality Date  . Lung lobectomy      right lung, partial   Social History  Substance Use Topics  . Smoking status: Never Smoker   . Smokeless tobacco: Not on file  . Alcohol Use: No   family history includes Diabetes in her father and mother; Leukemia in her maternal grandmother.  ROS as above Medications: Current Outpatient Prescriptions  Medication Sig Dispense Refill  . ibuprofen (ADVIL,MOTRIN) 200 MG tablet Take 200 mg by mouth every 6 (six) hours as needed.    . Vitamin D, Ergocalciferol, (DRISDOL) 50000 UNITS CAPS capsule Take 1 capsule (50,000 Units total) by mouth every 7 (seven) days. Take for 8 total doses(weeks) 8 capsule 0   No current facility-administered medications for this visit.   Allergies  Allergen Reactions  . Amoxicillin-Pot Clavulanate Rash     Exam:  BP 144/88 mmHg  Pulse 76  Wt 150 lb (68.04 kg) Gen: Well NAD Left foot normal-appearing no swelling or ecchymosis. Mildly tender to palpation distal fifth metatarsal.  Pulses capillary refill and sensation intact   Preliminary x-ray of the fracture of the left fifth metatarsal shows a nonsignificant change since previous x-ray. Awaiting formal radiology review.  No results found for this or any previous visit (from the past 24 hour(s)). No results found.   Please see individual assessment and plan sections.

## 2015-09-14 NOTE — Addendum Note (Signed)
Addended by: Rodolph BongOREY, Shakenya Stoneberg S on: 09/14/2015 08:54 AM   Modules accepted: Orders

## 2015-09-14 NOTE — Patient Instructions (Signed)
Thank you for coming in today. We will get a CT to discuss fracture.  We will call after CT results back.

## 2015-09-14 NOTE — Progress Notes (Signed)
Quick Note:  Vit D is quite low. I prescribed Vit D pills. ______

## 2015-09-14 NOTE — Assessment & Plan Note (Signed)
Not healed radiographically, and not healed clinically. Obtain CT scan of foot without contrast to evaluate fracture. She with results.

## 2015-09-15 ENCOUNTER — Ambulatory Visit (INDEPENDENT_AMBULATORY_CARE_PROVIDER_SITE_OTHER): Payer: Managed Care, Other (non HMO)

## 2015-09-15 ENCOUNTER — Telehealth: Payer: Self-pay | Admitting: Family Medicine

## 2015-09-15 DIAGNOSIS — X58XXXD Exposure to other specified factors, subsequent encounter: Secondary | ICD-10-CM

## 2015-09-15 DIAGNOSIS — S92355D Nondisplaced fracture of fifth metatarsal bone, left foot, subsequent encounter for fracture with routine healing: Secondary | ICD-10-CM | POA: Diagnosis not present

## 2015-09-15 DIAGNOSIS — S92302G Fracture of unspecified metatarsal bone(s), left foot, subsequent encounter for fracture with delayed healing: Secondary | ICD-10-CM

## 2015-09-15 NOTE — Telephone Encounter (Signed)
Discussed results of CT scan with pt.

## 2015-09-15 NOTE — Progress Notes (Signed)
Quick Note:  The xray shows some healing!!!!! I still think the CT is the right next test but things are less grim than I thought. ______

## 2015-10-05 ENCOUNTER — Ambulatory Visit (INDEPENDENT_AMBULATORY_CARE_PROVIDER_SITE_OTHER): Payer: Managed Care, Other (non HMO)

## 2015-10-05 ENCOUNTER — Ambulatory Visit (INDEPENDENT_AMBULATORY_CARE_PROVIDER_SITE_OTHER): Payer: Managed Care, Other (non HMO) | Admitting: Family Medicine

## 2015-10-05 ENCOUNTER — Encounter: Payer: Self-pay | Admitting: Family Medicine

## 2015-10-05 VITALS — BP 148/90 | HR 92 | Wt 151.0 lb

## 2015-10-05 DIAGNOSIS — S92302G Fracture of unspecified metatarsal bone(s), left foot, subsequent encounter for fracture with delayed healing: Secondary | ICD-10-CM | POA: Diagnosis not present

## 2015-10-05 DIAGNOSIS — X58XXXD Exposure to other specified factors, subsequent encounter: Secondary | ICD-10-CM

## 2015-10-05 DIAGNOSIS — S92352D Displaced fracture of fifth metatarsal bone, left foot, subsequent encounter for fracture with routine healing: Secondary | ICD-10-CM | POA: Diagnosis not present

## 2015-10-05 NOTE — Progress Notes (Signed)
Lori Pratt is a 49 y.o. female who [name] today for follow-up foot fracture. Patient was originally seen on September 19 for distal fifth metatarsal fracture of the left foot. She's been treated subsequently with immobilization and nonweightbearing now to postoperative shoe. She was thought to have poor bone healing over CT scan on 09/15/2015 showed good healing.    Past Medical History  Diagnosis Date  . Diabetes mellitus     type II, diet controlled   Past Surgical History  Procedure Laterality Date  . Lung lobectomy      right lung, partial   Social History  Substance Use Topics  . Smoking status: Never Smoker   . Smokeless tobacco: Not on file  . Alcohol Use: No   family history includes Diabetes in her father and mother; Leukemia in her maternal grandmother.  ROS as above Medications: Current Outpatient Prescriptions  Medication Sig Dispense Refill  . ibuprofen (ADVIL,MOTRIN) 200 MG tablet Take 200 mg by mouth every 6 (six) hours as needed.    . Vitamin D, Ergocalciferol, (DRISDOL) 50000 UNITS CAPS capsule Take 1 capsule (50,000 Units total) by mouth every 7 (seven) days. Take for 8 total doses(weeks) 8 capsule 0   No current facility-administered medications for this visit.   Allergies  Allergen Reactions  . Amoxicillin-Pot Clavulanate Rash     Exam:  BP 148/90 mmHg  Pulse 92  Wt 151 lb (68.493 kg) Gen: Well NAD Left foot: Normal-appearing. Minimally tender distal fifth metatarsal. Normal pulses capillary refill sensation and motion.   Preliminary x-ray of left foot shows good bowel healing with visible fracture line still present. Awaiting formal radiology review.  No results found for this or any previous visit (from the past 24 hour(s)). No results found.   Please see individual assessment and plan sections.

## 2015-10-05 NOTE — Progress Notes (Signed)
Quick Note:  Normal, no changes. ______ 

## 2015-10-05 NOTE — Patient Instructions (Signed)
Thank you for coming in today. Return in 1 month or sooner.  Try not using the rigid sole shoe at home.  Go back to the rigid sole shoe if the pain worsening or with longer activity.  Try to wean away from the rigid shoe.

## 2015-10-05 NOTE — Assessment & Plan Note (Signed)
Slow to heal but healing. Continue vitamin D. Liberalize immobilization. Return in one month. Use pain as a guide for postoperative shoe.

## 2015-11-05 ENCOUNTER — Ambulatory Visit (INDEPENDENT_AMBULATORY_CARE_PROVIDER_SITE_OTHER): Payer: Managed Care, Other (non HMO)

## 2015-11-05 ENCOUNTER — Ambulatory Visit (INDEPENDENT_AMBULATORY_CARE_PROVIDER_SITE_OTHER): Payer: Managed Care, Other (non HMO) | Admitting: Family Medicine

## 2015-11-05 ENCOUNTER — Encounter: Payer: Self-pay | Admitting: Family Medicine

## 2015-11-05 VITALS — BP 137/81 | HR 84 | Wt 156.0 lb

## 2015-11-05 DIAGNOSIS — S92352D Displaced fracture of fifth metatarsal bone, left foot, subsequent encounter for fracture with routine healing: Secondary | ICD-10-CM | POA: Diagnosis not present

## 2015-11-05 DIAGNOSIS — S92302G Fracture of unspecified metatarsal bone(s), left foot, subsequent encounter for fracture with delayed healing: Secondary | ICD-10-CM

## 2015-11-05 DIAGNOSIS — X58XXXD Exposure to other specified factors, subsequent encounter: Secondary | ICD-10-CM | POA: Diagnosis not present

## 2015-11-05 NOTE — Patient Instructions (Signed)
Thank you for coming in today. Move to regular shoes.  Return in a few weeks if not better.

## 2015-11-06 NOTE — Progress Notes (Signed)
Quick Note:  Xray shows healing ______ 

## 2015-11-06 NOTE — Assessment & Plan Note (Signed)
Fractures radiographically well-healed however patient continues to have some clinical soreness. I think this is more related to the postoperative shoe than anything else. We'll transition to regular shoes and recheck in a few weeks. If still sore will consider orthotics and MRI.

## 2015-11-06 NOTE — Progress Notes (Signed)
       Lori Pratt is a [age] y.o. female who [facility]: [facility] Care today for follow-up fifth metatarsal fracture. Patient was recently seen in September for fracture of her distal fifth metatarsal. The fracture took quite a long time to heal. However over the last month patient has been transitioned to a postoperative shoe and feels pretty well. She notes some mild to moderate soreness at the distal plantar foot in the area of the fracture. She otherwise is walking normally.   Past Medical History  Diagnosis Date  . Diabetes mellitus     type II, diet controlled   Past Surgical History  Procedure Laterality Date  . Lung lobectomy      right lung, partial   Social History  Substance Use Topics  . Smoking status: Never Smoker   . Smokeless tobacco: Not on file  . Alcohol Use: No   family history includes Diabetes in her father and mother; Leukemia in her maternal grandmother.  ROS as above Medications: Current Outpatient Prescriptions  Medication Sig Dispense Refill  . ibuprofen (ADVIL,MOTRIN) 200 MG tablet Take 200 mg by mouth every 6 (six) hours as needed.    . Vitamin D, Ergocalciferol, (DRISDOL) 50000 UNITS CAPS capsule Take 1 capsule (50,000 Units total) by mouth every 7 (seven) days. Take for 8 total doses(weeks) 8 capsule 0   No current facility-administered medications for this visit.   Allergies  Allergen Reactions  . Amoxicillin-Pot Clavulanate Rash     Exam:  BP 137/81 mmHg  Pulse 84  Wt 156 lb (70.761 kg) Gen: Well NAD Left foot is completely normal-appearing. Tender to palpation mildly plantar distal fifth metatarsal area.  No results found for this or any previous visit (from the past 24 hour(s)). Dg Foot Complete Left  11/05/2015  CLINICAL DATA:  Followup distal fifth metatarsal fracture. EXAM: LEFT FOOT - COMPLETE 3+ VIEW COMPARISON:  10/05/2015.  FINDINGS: The distal fifth metatarsal shaft fracture is healed. No new/ acute bony findings. Stable osteopenia. Small calcaneal heel spur and pes cavus are again noted. IMPRESSION: Healed distal fifth metatarsal fracture. Electronically Signed   By: Rudie MeyerP.  Gallerani M.D.   On: 11/05/2015 17:08     Please see individual assessment and plan sections.

## 2016-05-27 IMAGING — CT CT FOOT*L* W/O CM
3 of 6 series · 9 of 27 positions shown, 10 images · non-contrast
Comparison: Multiple prior plain films from 07/02/2015 to [DATE]

CLINICAL DATA: History of fifth metatarsal fracture approximately
12 weeks ago with continued pain. Question nonunion.

EXAM:
CT OF THE LEFT FOOT WITHOUT CONTRAST
TECHNIQUE: Multidetector CT imaging of the left foot was performed according to
the standard protocol. Multiplanar CT image reconstructions were
also generated.

[Series 400: coronal bone · coronal · 0.59mm/px · 5 of 112 slices shown]
[im 19/112  bone]
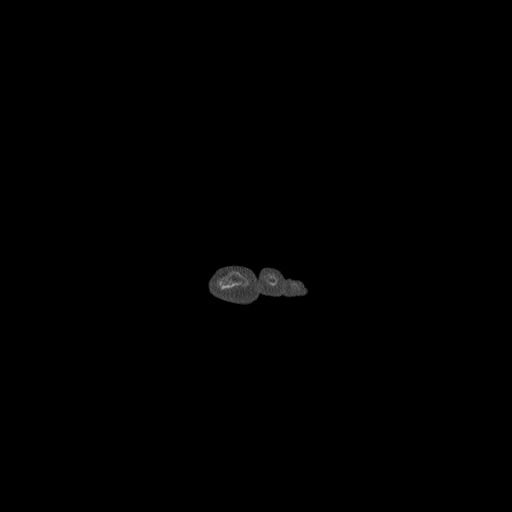
[im 38/112  bone]
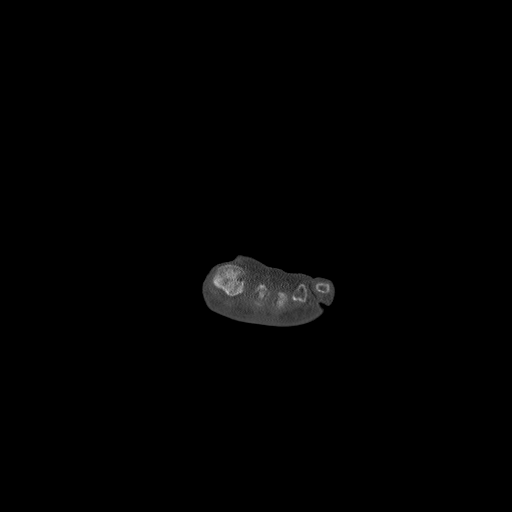
[im 56/112  bone]
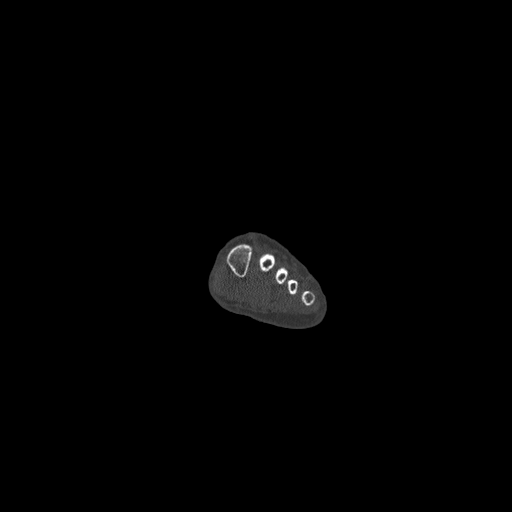
[im 75/112  bone]
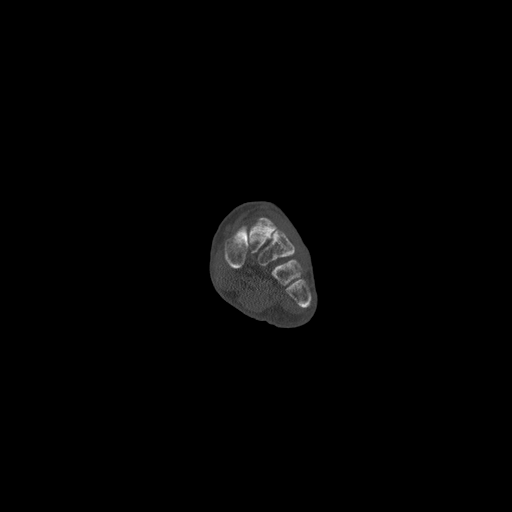
[im 93/112  bone]
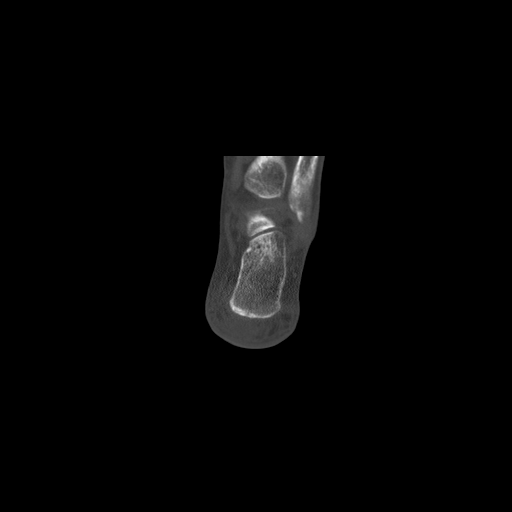

[Series 402: sag bone · sagittal · 0.59mm/px · 2 of 53 slices shown, 3 images]
[im 18/53  soft-tissue]
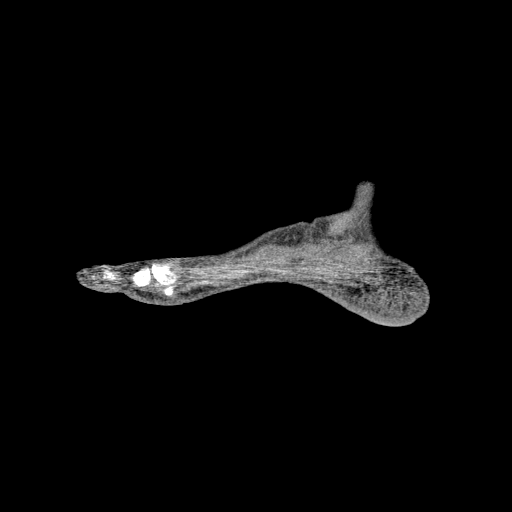
[im 18/53  bone]
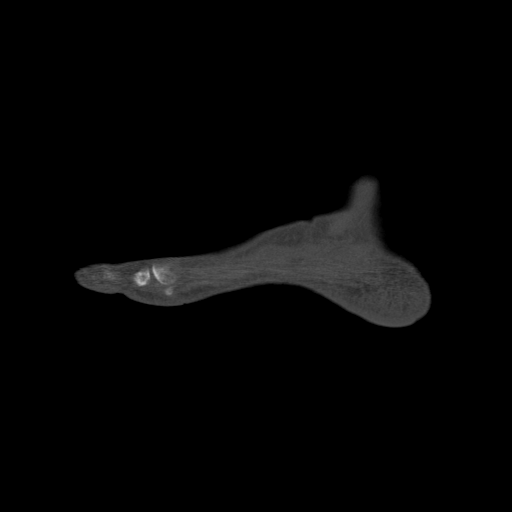
[im 35/53  bone]
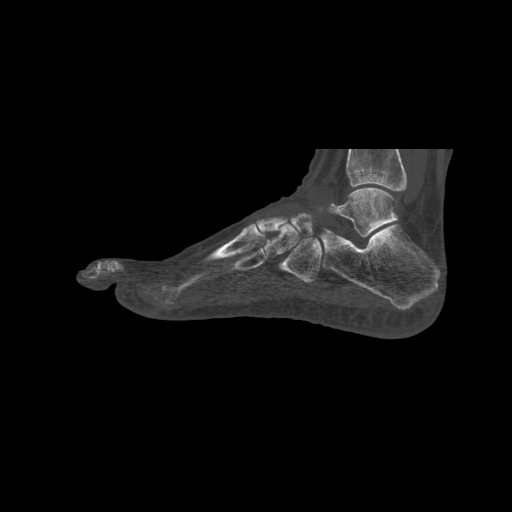

[Series 403: sag soft · sagittal · 0.59mm/px · 2 of 53 slices shown]
[im 18/53  soft-tissue]
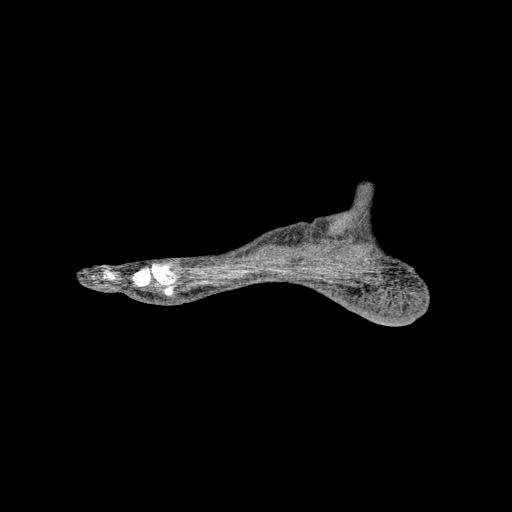
[im 35/53  soft-tissue]
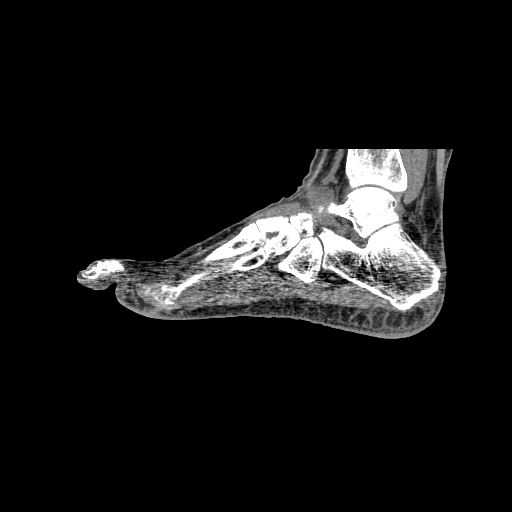

[9 of 27 positions shown; findings below may reference images not displayed]

FINDINGS: Nondisplaced fracture of the distal diaphysis of the fifth
metatarsal is identified as on prior examinations, extensive
bridging bone is present about the fracture. A small segment of the
fracture line is visualized measuring 0.6 cm transverse along the
inferior cortex of the fifth metatarsal diaphysis. This component of
the fracture is also nondisplaced. The bulk of the fracture appears
solidly healed. Bones are somewhat osteopenic. No new fracture is
identified.
IMPRESSION: Nondisplaced distal diaphyseal fracture of the fifth metatarsal.
Only a small segment of the fracture line measuring 0.6 cm remains
visible across the plantar cortex of the fifth metatarsal. The
remainder of the fracture appears well healed.

Osteopenia likely related to disuse.

## 2017-02-21 ENCOUNTER — Emergency Department (INDEPENDENT_AMBULATORY_CARE_PROVIDER_SITE_OTHER)
Admission: EM | Admit: 2017-02-21 | Discharge: 2017-02-21 | Disposition: A | Discharge status: Home / Self Care | Payer: Managed Care, Other (non HMO) | Source: Home / Self Care | Attending: Family Medicine | Admitting: Family Medicine

## 2017-02-21 ENCOUNTER — Other Ambulatory Visit: Payer: Self-pay

## 2017-02-21 ENCOUNTER — Encounter: Payer: Self-pay | Admitting: *Deleted

## 2017-02-21 DIAGNOSIS — R42 Dizziness and giddiness: Secondary | ICD-10-CM

## 2017-02-21 DIAGNOSIS — R079 Chest pain, unspecified: Secondary | ICD-10-CM

## 2017-02-21 DIAGNOSIS — R030 Elevated blood-pressure reading, without diagnosis of hypertension: Secondary | ICD-10-CM

## 2017-02-21 DIAGNOSIS — R000 Tachycardia, unspecified: Secondary | ICD-10-CM

## 2017-02-21 DIAGNOSIS — R0602 Shortness of breath: Secondary | ICD-10-CM

## 2017-02-21 HISTORY — DX: Anxiety disorder, unspecified: F41.9

## 2017-02-21 NOTE — ED Provider Notes (Signed)
CSN: [id number]     Arrival date & time 02/21/17  1902 History   First MD Initiated Contact with Patient 02/21/17 1910     Chief Complaint  Patient presents with  . Chest Pain   (Consider location/radiation/quality/duration/timing/severity/associated sxs/prior Treatment) HPI  Aneth Schlagel is a 51 y.o. female presenting to UC with c/o lightheadedness to the point of almost loosing her balance over the last 1 week, palpitations today and pressure in the center of her chest with SOB.  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  No hx of CAD or known family hx of CAD.  Hx of anxiety but denies hx of panic attack.  Hx of DM but not taking medication as it is controlled with diet and exercise.  She does take Zoloft. No recent change in dose. No known stress, decreased sleep, recent travel or recent illness. No hx of blood clots.    Past Medical History:  Diagnosis Date  . Anxiety   . Diabetes mellitus    type II, diet controlled   Past Surgical History:  Procedure Laterality Date  . LUNG LOBECTOMY     right lung, partial   Family History  Problem Relation Age of Onset  . Diabetes Mother   . Diabetes Father   . Leukemia Maternal Grandmother    Social History  Substance Use Topics  . Smoking status: Never Smoker  . Smokeless tobacco: Never Used  . Alcohol use No   OB History    No data available     Review of Systems  Constitutional: Negative for chills and fever.  HENT: Negative for congestion, ear pain, sore throat, trouble swallowing and voice change.   Respiratory: Positive for chest tightness and shortness of breath. Negative for cough.   Cardiovascular: Positive for chest pain and palpitations. Negative for leg swelling.  Gastrointestinal: Negative for abdominal pain, diarrhea, nausea and vomiting.  Musculoskeletal: Negative for arthralgias, back pain and myalgias.  Skin: Negative for rash.  Neurological: Positive for  dizziness, syncope (near), weakness and light-headedness. Negative for headaches.    Allergies  Amoxicillin-pot clavulanate  Home Medications   Prior to Admission medications   Medication Sig Start Date End Date Taking? Authorizing Provider  sertraline (ZOLOFT) 50 MG tablet Take 50 mg by mouth daily.   Yes Historical Provider, MD   Meds Ordered and Administered this Visit  Medications - No data to display  BP (!) 168/118 (BP Location: Left Arm)   Pulse (!) 108   Temp 98.2 F (36.8 C) (Oral)   Resp 18   Ht  (1.702 m)   Wt 156 lb (70.8 kg)   SpO2 99%   BMI 24.43 kg/m  No data found.   Physical Exam  Constitutional: She is oriented to person, place, and time. She appears well-developed and well-nourished. No distress.  HENT:  Head: Normocephalic and atraumatic.  Mouth/Throat: Oropharynx is clear and moist.  Eyes: EOM are normal. Pupils are equal, round, and reactive to light.  Neck: Normal range of motion.  Cardiovascular: Regular rhythm.  Tachycardia present.   Pulmonary/Chest: Effort normal and breath sounds normal. No respiratory distress. She has no wheezes. She has no rales.  Musculoskeletal: Normal range of motion.  Neurological: She is alert and oriented to person, place, and time.  Skin: Skin is warm and dry. She is not diaphoretic.  Psychiatric: She has a normal mood and affect. Her behavior is normal.  Nursing note and  vitals reviewed.   Urgent Care Course     Procedures (including critical care time)  Labs Review Labs Reviewed - No data to display  Imaging Review No results found.  Date/Time:02/21/2017   19:08:26 Ventricular Rate: 104 PR Interval: 170 QRS Duration: 88 QT Interval: 348 QTC Calculation: 457 P-R-T axes  24   -24   33 Text Interpretation: Sinus tachycardia, otherwise normal EKG     MDM   1. Nonspecific chest pain   2. Dizziness   3. Tachycardia   4. Elevated blood pressure reading    Pt c/o vague pressure in  her chest, and SOB that started about 2-3 hours ago, preceded by about 1 week of fatigue and lightheadedness.  Pt is tachycardic with regular rhythm Elevated BP: 168/118  Recommend pt go to emergency department for further workup to r/o CAD, PE, or other emergent process. Pt is accompanied by husband who feels comfortable driving pt via POV to [facility].     [place], [place] 02/21/17 1924

## 2017-02-21 NOTE — ED Triage Notes (Signed)
Pt c/o feeling light headed x 1 wk, with feeling of irregular heart rate x 1 day. Today she reports center of her chest pressure with feeling of fluttering heart and SOB.

## 2021-01-31 ENCOUNTER — Emergency Department
Admission: EM | Admit: 2021-01-31 | Discharge: 2021-01-31 | Disposition: A | Discharge status: Home / Self Care | Payer: BC Managed Care – PPO | Source: Home / Self Care | Attending: Family Medicine | Admitting: Family Medicine

## 2021-01-31 ENCOUNTER — Other Ambulatory Visit: Payer: Self-pay

## 2021-01-31 ENCOUNTER — Emergency Department (INDEPENDENT_AMBULATORY_CARE_PROVIDER_SITE_OTHER): Payer: BC Managed Care – PPO

## 2021-01-31 DIAGNOSIS — S99922A Unspecified injury of left foot, initial encounter: Secondary | ICD-10-CM | POA: Diagnosis not present

## 2021-01-31 DIAGNOSIS — S93602A Unspecified sprain of left foot, initial encounter: Secondary | ICD-10-CM | POA: Diagnosis not present

## 2021-01-31 HISTORY — DX: Hyperlipidemia, unspecified: E78.5

## 2021-01-31 NOTE — ED Triage Notes (Signed)
Pt presents to Urgent Care with c/o L foot pain following injury a few hours ago. Reports that her foot "rolled" as she was walking in her garage. States she has had a fracture in this foot in the past. Swelling and reported pain noted to be below 5th toe along distal, lateral foot.

## 2021-01-31 NOTE — Discharge Instructions (Addendum)
Limit walking while foot is painful Take Aleve 2 pills twice a day for pain May take ibuprofen or Tylenol if preferred Follow-up if you fail to improve over the next week or 2

## 2021-01-31 NOTE — ED Provider Notes (Signed)
[facility]    CSN: [id number] Arrival date & time: 01/31/21  1503      History   Chief Complaint Chief Complaint  Patient presents with  . Foot Pain    Left  . Foot Injury    Left    HPI Lori Pratt is a 55 y.o. female.   HPI   Patient states she tripped walking through a doorway, inverted her foot and fell.  Has pain all across her metatarsals and midfoot.  Minor swelling.  No discoloration.  This happened today just a few hours ago.  Walking with a distinct limp. Well-controlled diabetes.  Past Medical History:  Diagnosis Date  . Anxiety   . Diabetes mellitus    type II, diet controlled  . Hyperlipidemia     Patient Active Problem List   Diagnosis Date Noted  . Vitamin D deficiency 09/14/2015  . Osteopenia 08/14/2015  . Post-menopausal 07/27/2015  . Fracture of 5th metatarsal 07/13/2015    Past Surgical History:  Procedure Laterality Date  . LUNG LOBECTOMY     right lung, partial    OB History   No obstetric history on file.      Home Medications    Prior to Admission medications   Medication Sig Start Date End Date Taking? Authorizing Provider  atorvastatin (LIPITOR) 10 MG tablet Take 10 mg by mouth daily.   Yes [provider]  busPIRone (BUSPAR) 5 MG tablet Take 5 mg by mouth 3 (three) times daily.   Yes [provider]  Empagliflozin-metFORMIN HCl (SYNJARDY) 12.02-999 MG TABS Take 1 tablet by mouth.   Yes [provider]  atorvastatin (LIPITOR) 40 MG tablet Take 1 tablet by mouth daily. 01/12/21   [provider]  sertraline (ZOLOFT) 50 MG tablet Take 50 mg by mouth daily.    [provider]    Family History Family History  Problem Relation Age of Onset  . Diabetes Mother   . Diabetes Father   . Leukemia Maternal Grandmother     Social History Social History   Tobacco Use  . Smoking status: Never Smoker  . Smokeless tobacco: Never Used  Vaping Use  . Vaping Use: Never  used  Substance Use Topics  . Alcohol use: No  . Drug use: No     Allergies   Amoxicillin-pot clavulanate   Review of Systems Review of Systems See HPI  Physical Exam Triage Vital Signs ED Triage Vitals [01/31/21 1537]  Enc Vitals Group     BP      Pulse      Resp      Temp      Temp src      SpO2      Weight 150 lb (68 kg)     Height 5\' 7"  (1.702 m)     Head Circumference      Peak Flow      Pain Score 5     Pain Loc      Pain Edu?      Excl. in GC?    No data found.  Updated Vital Signs BP 135/83 (BP Location: Right Arm)   Pulse 95   Temp 98.8 F (37.1 C) (Oral)   Resp 18   Ht 5\' 7"  (1.702 m)   Wt 68 kg   SpO2 98%   BMI 23.49 kg/m      Physical Exam Constitutional:      General: She is not in acute distress.  Appearance: She is well-developed and normal weight.  HENT:     Head: Normocephalic and atraumatic.  Eyes:     Conjunctiva/sclera: Conjunctivae normal.     Pupils: Pupils are equal, round, and reactive to light.  Cardiovascular:     Rate and Rhythm: Normal rate.  Pulmonary:     Effort: Pulmonary effort is normal. No respiratory distress.  Abdominal:     General: There is no distension.     Palpations: Abdomen is soft.  Musculoskeletal:        General: Normal range of motion.     Cervical back: Normal range of motion.     Comments: Left foot is examined.  No soft tissue swelling is identified.  Pain with any squeeze pressure across metatarsals or palpation of the midfoot.  Skin:    General: Skin is warm and dry.  Neurological:     Mental Status: She is alert.     Gait: Gait abnormal.      UC Treatments / Results  Labs (all labs ordered are listed, but only abnormal results are displayed) Labs Reviewed - No data to display  EKG   Radiology DG Foot Complete Left  Result Date: 01/31/2021 CLINICAL DATA:  Injury with pain. EXAM: LEFT FOOT - COMPLETE 3+ VIEW COMPARISON:  None. FINDINGS: There is no evidence of fracture or  dislocation. There is no evidence of arthropathy or other focal bone abnormality. Soft tissues are unremarkable. IMPRESSION: Negative. Electronically Signed   By: Kennith Center M.D.   On: 01/31/2021 16:14    Procedures Procedures (including critical care time)  Medications Ordered in UC Medications - No data to display  Initial Impression / Assessment and Plan / UC Course  I have reviewed the triage vital signs and the nursing notes.  Pertinent labs & imaging results that were available during my care of the patient were reviewed by me and considered in my medical decision making (see chart for details).     No fracture identified.  Patient has a foot sprain. Final Clinical Impressions(s) / UC Diagnoses   Final diagnoses:  Sprain of left foot, initial encounter     Discharge Instructions     Limit walking while foot is painful Take Aleve 2 pills twice a day for pain May take ibuprofen or Tylenol if preferred Follow-up if you fail to improve over the next week or 2     ED Prescriptions    None     PDMP not reviewed this encounter.   Eustace Moore, MD 01/31/21 [phone]

## 2021-07-18 ENCOUNTER — Other Ambulatory Visit: Payer: Self-pay

## 2021-07-18 ENCOUNTER — Encounter: Payer: Self-pay | Admitting: Emergency Medicine

## 2021-07-18 ENCOUNTER — Emergency Department
Admission: EM | Admit: 2021-07-18 | Discharge: 2021-07-18 | Disposition: A | Discharge status: Home / Self Care | Payer: BC Managed Care – PPO | Source: Home / Self Care

## 2021-07-18 DIAGNOSIS — R81 Glycosuria: Secondary | ICD-10-CM

## 2021-07-18 DIAGNOSIS — R319 Hematuria, unspecified: Secondary | ICD-10-CM | POA: Diagnosis not present

## 2021-07-18 LAB — POCT URINALYSIS DIP (MANUAL ENTRY)
Bilirubin, UA: NEGATIVE
Glucose, UA: >=1000 mg/dL — AB
Ketones, POC UA: NEGATIVE mg/dL
Leukocytes, UA: NEGATIVE
Nitrite, UA: NEGATIVE
Protein Ur, POC: 30 mg/dL — AB
Spec Grav, UA: 1.015 (ref 1.010–1.025)
Urobilinogen, UA: 0.2 E.U./dL
pH, UA: 6 (ref 5.0–8.0)

## 2021-07-18 NOTE — ED Triage Notes (Signed)
Patient presents to Urgent Care with complaints of blood in urine since 1 day ago. Patient reports denies any painful urination or low back pain. Little pelvic pain-cramping. Doesn't drink enough water.

## 2021-07-18 NOTE — ED Provider Notes (Signed)
[facility]    CSN: [id number] Arrival date & time: 07/18/21  1131      History   Chief Complaint Chief Complaint  Patient presents with   Hematuria    HPI Lori Pratt is a 55 y.o. female.   HPI 55 year old female presents with blood in urine for 1 day.  Patient denies dysuria or flank/low back pain.  PMH significant for nephrolithiasis.  Past Medical History:  Diagnosis Date   Anxiety    Diabetes mellitus    type II, diet controlled   Hyperlipidemia     Patient Active Problem List   Diagnosis Date Noted   Vitamin D deficiency 09/14/2015   Osteopenia 08/14/2015   Post-menopausal 07/27/2015   Fracture of 5th metatarsal 07/13/2015    Past Surgical History:  Procedure Laterality Date   LUNG LOBECTOMY     right lung, partial    OB History   No obstetric history on file.      Home Medications    Prior to Admission medications   Medication Sig Start Date End Date Taking? Authorizing Provider  atorvastatin (LIPITOR) 40 MG tablet Take 1 tablet by mouth daily. 01/12/21  Yes [provider]  busPIRone (BUSPAR) 5 MG tablet Take 5 mg by mouth 3 (three) times daily.   Yes [provider]  Empagliflozin-metFORMIN HCl (SYNJARDY) 12.02-999 MG TABS Take 1 tablet by mouth.   Yes [provider]  sertraline (ZOLOFT) 50 MG tablet Take 50 mg by mouth daily.   Yes [provider]  atorvastatin (LIPITOR) 10 MG tablet Take 10 mg by mouth daily.    [provider]    Family History Family History  Problem Relation Age of Onset   Diabetes Mother    Diabetes Father    Leukemia Maternal Grandmother     Social History Social History   Tobacco Use   Smoking status: Never   Smokeless tobacco: Never  Vaping Use   Vaping Use: Never used  Substance Use Topics   Alcohol use: No   Drug use: No     Allergies   Amoxicillin-pot clavulanate   Review of Systems Review of Systems  Genitourinary:  Positive for  hematuria.  All other systems reviewed and are negative.   Physical Exam Triage Vital Signs ED Triage Vitals  Enc Vitals Group     BP 07/18/21 1204 (!) 137/91     Pulse Rate 07/18/21 1204 92     Resp 07/18/21 1204 16     Temp 07/18/21 1204 98.6 F (37 C)     Temp Source 07/18/21 1204 Oral     SpO2 07/18/21 1204 97 %     Weight --      Height --      Head Circumference --      Peak Flow --      Pain Score 07/18/21 1202 1     Pain Loc --      Pain Edu? --      Excl. in GC? --    No data found.  Updated Vital Signs BP (!) 137/91 (BP Location: Right Arm)   Pulse 92   Temp 98.6 F (37 C) (Oral)   Resp 16   SpO2 97%       Physical Exam Vitals reviewed.  Constitutional:      General: She is not in acute distress.    Appearance: Normal appearance. She is normal weight. She is not ill-appearing.  HENT:     Head:  Normocephalic and atraumatic.     Nose: Nose normal.     Mouth/Throat:     Mouth: Mucous membranes are moist.     Pharynx: Oropharynx is clear.  Eyes:     Extraocular Movements: Extraocular movements intact.     Conjunctiva/sclera: Conjunctivae normal.     Pupils: Pupils are equal, round, and reactive to light.  Cardiovascular:     Rate and Rhythm: Normal rate and regular rhythm.     Pulses: Normal pulses.     Heart sounds: Normal heart sounds.  Pulmonary:     Effort: Pulmonary effort is normal.     Breath sounds: Normal breath sounds.     Comments: No adventitious breath sounds noted Abdominal:     Tenderness: There is no right CVA tenderness or left CVA tenderness.  Musculoskeletal:        General: Normal range of motion.     Cervical back: Normal range of motion and neck supple.  Skin:    General: Skin is warm and dry.  Neurological:     General: No focal deficit present.     Mental Status: She is alert and oriented to person, place, and time. Mental status is at baseline.     UC Treatments / Results  Labs (all labs ordered are listed, but  only abnormal results are displayed) Labs Reviewed  POCT URINALYSIS DIP (MANUAL ENTRY) - Abnormal; Notable for the following components:      Result Value   Color, UA red (*)    Clarity, UA cloudy (*)    Glucose, UA >=1,000 (*)    Blood, UA large (*)    Protein Ur, POC =30 (*)    All other components within normal limits  URINE CULTURE    EKG   Radiology No results found.  Procedures Procedures (including critical care time)  Medications Ordered in UC Medications - No data to display  Initial Impression / Assessment and Plan / UC Course  I have reviewed the triage vital signs and the nursing notes.  Pertinent labs & imaging results that were available during my care of the patient were reviewed by me and considered in my medical decision making (see chart for details).     MDM: 1.  Hematuria-urine culture ordered; 2.  Glucosuria-advised patient to follow-up with PCP for further evaluation. Advised patient we will follow-up with her once urine culture returns.  Patient discharged home, hemodynamically stable. Final Clinical Impressions(s) / UC Diagnoses   Final diagnoses:  Hematuria, unspecified type  Glucosuria     Discharge Instructions      Advised patient we will follow-up with her once urine culture returns.     ED Prescriptions   None    PDMP not reviewed this encounter.   Trevor Iha, FNP 07/18/21 1244

## 2021-07-18 NOTE — Discharge Instructions (Addendum)
Advised patient we will follow-up with her once urine culture returns.

## 2021-07-20 LAB — URINE CULTURE
MICRO NUMBER:: 12421069
SPECIMEN QUALITY:: ADEQUATE

## 2021-07-22 ENCOUNTER — Telehealth: Payer: Self-pay | Admitting: Emergency Medicine

## 2024-10-23 ENCOUNTER — Encounter: Payer: Self-pay | Admitting: Emergency Medicine

## 2024-10-23 ENCOUNTER — Ambulatory Visit: Admission: EM | Admit: 2024-10-23 | Discharge: 2024-10-23 | Disposition: A | Discharge status: Home / Self Care

## 2024-10-23 DIAGNOSIS — J069 Acute upper respiratory infection, unspecified: Secondary | ICD-10-CM

## 2024-10-23 MED ORDER — PREDNISONE 20 MG PO TABS: 40.0000 mg | ORAL_TABLET | Freq: Every day | ORAL | 0 refills | Status: AC

## 2024-10-23 MED ORDER — AZITHROMYCIN 250 MG PO TABS: ORAL_TABLET | ORAL | 0 refills | Status: AC

## 2024-10-23 NOTE — ED Provider Notes (Signed)
 " [facility]    CSN: [id number] Arrival date & time: 10/23/24  9073      History   Chief Complaint Chief Complaint  Patient presents with   Otalgia    HPI Lori Pratt is a 58 y.o. female.   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  Think she has a sinus infection.  Has sinus congestion, postnasal drip, slight cough, sore throat, and ear pressure and pain.  Symptoms for 2 days.  Non-smoker.    Past Medical History:  Diagnosis Date   Anxiety    Diabetes mellitus    type II, diet controlled   Hyperlipidemia     Patient Active Problem List   Diagnosis Date Noted   Vitamin D  deficiency 09/14/2015   Osteopenia 08/14/2015   Post-menopausal 07/27/2015   Fracture of 5th metatarsal 07/13/2015    Past Surgical History:  Procedure Laterality Date   LUNG LOBECTOMY     right lung, partial    OB History   No obstetric history on file.      Home Medications    Prior to Admission medications  Medication Sig Start Date End Date Taking? Authorizing Provider  amphetamine-dextroamphetamine (ADDERALL XR) 30 MG 24 hr capsule Take 30 mg by mouth daily. 10/23/24  Yes [provider]  atorvastatin (LIPITOR) 40 MG tablet Take 1 tablet by mouth daily. 01/12/21  Yes [provider]  azithromycin (ZITHROMAX Z-PAK) 250 MG tablet Take two pills today followed by one a day until gone 10/23/24  Yes Maranda Jamee Jacob, MD  Continuous Glucose Sensor (DEXCOM G7 SENSOR) MISC as directed.   Yes [provider]  Empagliflozin-metFORMIN HCl (SYNJARDY) 12.02-999 MG TABS Take 1 tablet by mouth.   Yes [provider]  escitalopram (LEXAPRO) 20 MG tablet Take 20 mg by mouth. 09/10/21  Yes [provider]  predniSONE (DELTASONE) 20 MG tablet Take 2 tablets (40 mg total) by mouth daily with breakfast. 10/23/24  Yes Maranda Jamee Jacob, MD  Semaglutide,0.25 or 0.5MG /DOS, (OZEMPIC, 0.25 OR 0.5 MG/DOSE,) 2 MG/3ML SOPN  Inject into the skin. 04/16/24  Yes [provider]  Vitamin D , Ergocalciferol , (DRISDOL ) 1.25 MG (50000 UNIT) CAPS capsule Take 50,000 Units by mouth. 09/24/24  Yes [provider]    Family History Family History  Problem Relation Age of Onset   Diabetes Mother    Diabetes Father    Leukemia Maternal Grandmother     Social History Social History[1]   Allergies   Amoxicillin-pot clavulanate   Review of Systems Review of Systems See HPI  Physical Exam Triage Vital Signs ED Triage Vitals  Encounter Vitals Group     BP 10/23/24 0951 138/87     Girls Systolic BP Percentile --      Girls Diastolic BP Percentile --      Boys Systolic BP Percentile --      Boys Diastolic BP Percentile --      Pulse Rate 10/23/24 0951 (!) 101     Resp 10/23/24 0951 18     Temp 10/23/24 0951 98.8 F (37.1 C)     Temp Source 10/23/24 0951 Oral     SpO2 10/23/24 0951 96 %     Weight 10/23/24 0949 145 lb (65.8 kg)     Height 10/23/24 0949 5' 7 (1.702 m)     Head Circumference --      Peak Flow --      Pain Score 10/23/24 0949 7  Pain Loc --      Pain Education --      Exclude from Growth Chart --    No data found.  Updated Vital Signs BP 138/87 (BP Location: Right Arm)   Pulse (!) 101   Temp 98.8 F (37.1 C) (Oral)   Resp 18   Ht 5' 7 (1.702 m)   Wt 65.8 kg   SpO2 96%   BMI 22.71 kg/m   Physical Exam Constitutional:      General: She is not in acute distress.    Appearance: She is well-developed and normal weight. She is not ill-appearing.  HENT:     Head: Normocephalic and atraumatic.     Right Ear: Tympanic membrane normal.     Left Ear: Tympanic membrane normal.     Nose: Congestion and rhinorrhea present.     Mouth/Throat:     Pharynx: Posterior oropharyngeal erythema present.  Eyes:     Conjunctiva/sclera: Conjunctivae normal.     Pupils: Pupils are equal, round, and reactive to light.  Cardiovascular:     Rate and Rhythm: Normal rate and  regular rhythm.     Heart sounds: Normal heart sounds.  Pulmonary:     Effort: Pulmonary effort is normal. No respiratory distress.     Breath sounds: Normal breath sounds.  Musculoskeletal:        General: Normal range of motion.     Cervical back: Normal range of motion.  Lymphadenopathy:     Cervical: Cervical adenopathy present.  Skin:    General: Skin is warm and dry.  Neurological:     Mental Status: She is alert.      UC Treatments / Results  Labs (all labs ordered are listed, but only abnormal results are displayed) Labs Reviewed - No data to display  EKG   Radiology No results found.  Procedures Procedures (including critical care time)  Medications Ordered in UC Medications - No data to display  Initial Impression / Assessment and Plan / UC Course  I have reviewed the triage vital signs and the nursing notes.  Pertinent labs & imaging results that were available during my care of the patient were reviewed by me and considered in my medical decision making (see chart for details).     Discussed that she did is likely caused by a virus.  Encouraged home care.  Antibiotics only if fails to improve Final Clinical Impressions(s) / UC Diagnoses   Final diagnoses:  Viral upper respiratory tract infection     Discharge Instructions      Make sure you are drinking lots of fluids Prednisone once a day for 5 days This will help relieve congestion and ear pain If you fail to improve over the next several days you can add azithromycin antibiotic See your doctor if not better by next week   ED Prescriptions     Medication Sig Dispense Auth. Provider   predniSONE (DELTASONE) 20 MG tablet Take 2 tablets (40 mg total) by mouth daily with breakfast. 10 tablet Maranda Jamee Jacob, MD   azithromycin (ZITHROMAX Z-PAK) 250 MG tablet Take two pills today followed by one a day until gone 6 tablet Maranda Jamee Jacob, MD      PDMP not reviewed this encounter.     [1]  Social History Tobacco Use   Smoking status: Never   Smokeless tobacco: Never  Vaping Use   Vaping status: Never Used  Substance Use Topics   Alcohol use: No  Drug use: No     Maranda Jamee Jacob, MD 10/23/24 1035  "

## 2024-10-23 NOTE — ED Triage Notes (Signed)
 Patient c/o sore throat, post nasal drainage and bilateral ear pain for a couple of days.  Patient has taken Mucinex and [facility] Brand of sinus medication.

## 2024-10-23 NOTE — Discharge Instructions (Signed)
 Make sure you are drinking lots of fluids Prednisone once a day for 5 days This will help relieve congestion and ear pain If you fail to improve over the next several days you can add azithromycin antibiotic See your doctor if not better by next week
